# Patient Record
Sex: Male | Born: 1995 | Race: Black or African American | Hispanic: No | Marital: Single | Smoking: Current every day smoker
Health system: Southern US, Community
[De-identification: ages and names within clinical notes are randomized; demographics above are authoritative.]

## PROBLEM LIST (undated history)

## (undated) DIAGNOSIS — E669 Obesity, unspecified: Secondary | ICD-10-CM

## (undated) HISTORY — DX: Obesity, unspecified: E66.9

---
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
## 2015-03-13 ENCOUNTER — Ambulatory Visit (INDEPENDENT_AMBULATORY_CARE_PROVIDER_SITE_OTHER): Payer: 59 | Admitting: Family Medicine

## 2015-03-13 ENCOUNTER — Encounter: Payer: Self-pay | Admitting: Family Medicine

## 2015-03-13 VITALS — BP 120/80 | HR 98 | Wt 234.0 lb

## 2015-03-13 DIAGNOSIS — K602 Anal fissure, unspecified: Secondary | ICD-10-CM

## 2015-03-13 NOTE — Progress Notes (Signed)
   Subjective:    Patient ID: Donald Quaker., male    DOB: 1996-06-29, 19 y.o.   MRN: [id number]  HPI he states that over the last 3 days, he has seen bright red blood with bowel movements. He does not complain of any pain with them. He has not been constipated. He notes blood on the toilet paper.  Review of Systems     Objective:   Physical Exam Alert and in no distress. Anal exam does show a fissure present at 6:00 that was slightly tender to palpation. No other lesions seen.       Assessment & Plan:  Anal fissure  I discussed the diagnosis with him and recommended fluids, bulk in his diet, exercise, gentle cleaning to the area as well as using a shower to irrigate the area. He will call if further trouble.

## 2015-03-13 NOTE — Patient Instructions (Signed)
Fluids, bulk in your diet, exercise

## 2017-01-04 ENCOUNTER — Emergency Department (HOSPITAL_COMMUNITY)
Admission: EM | Admit: 2017-01-04 | Discharge: 2017-01-04 | Disposition: A | Discharge status: Home / Self Care | Payer: 59 | Attending: Emergency Medicine | Admitting: Emergency Medicine

## 2017-01-04 ENCOUNTER — Encounter (HOSPITAL_COMMUNITY): Payer: Self-pay | Admitting: Emergency Medicine

## 2017-01-04 ENCOUNTER — Emergency Department (HOSPITAL_COMMUNITY): Payer: 59

## 2017-01-04 DIAGNOSIS — W228XXA Striking against or struck by other objects, initial encounter: Secondary | ICD-10-CM | POA: Diagnosis not present

## 2017-01-04 DIAGNOSIS — Y929 Unspecified place or not applicable: Secondary | ICD-10-CM | POA: Insufficient documentation

## 2017-01-04 DIAGNOSIS — S62316A Displaced fracture of base of fifth metacarpal bone, right hand, initial encounter for closed fracture: Secondary | ICD-10-CM | POA: Diagnosis not present

## 2017-01-04 DIAGNOSIS — F17210 Nicotine dependence, cigarettes, uncomplicated: Secondary | ICD-10-CM | POA: Diagnosis not present

## 2017-01-04 DIAGNOSIS — Y9389 Activity, other specified: Secondary | ICD-10-CM | POA: Insufficient documentation

## 2017-01-04 DIAGNOSIS — S6991XA Unspecified injury of right wrist, hand and finger(s), initial encounter: Secondary | ICD-10-CM | POA: Diagnosis present

## 2017-01-04 DIAGNOSIS — Y999 Unspecified external cause status: Secondary | ICD-10-CM | POA: Diagnosis not present

## 2017-01-04 MED ORDER — IBUPROFEN 800 MG PO TABS
800.0000 mg | ORAL_TABLET | Freq: Once | ORAL | Status: AC
  Administered 2017-01-04: 800 mg via ORAL
  Filled 2017-01-04: qty 1

## 2017-01-04 MED ORDER — MELOXICAM 15 MG PO TABS: 15.0000 mg | ORAL_TABLET | Freq: Every day | ORAL | 0 refills | Status: DC

## 2017-01-04 NOTE — Discharge Instructions (Signed)
Get help right away if: °· You develop a rash. °· You have trouble breathing. °· Your skin or nails on your injured hand turn blue or gray even after you loosen your splint. °· Your injured hand feels cold or becomes numb even after you loosen your splint. °· You develop severe pain under the cast or in your hand. ° °
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
## 2017-01-04 NOTE — ED Notes (Signed)
Ortho tech at bedside 
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
## 2017-01-04 NOTE — ED Notes (Signed)
Donald DikeJennifer with ortho contacted this RN in response to page, made aware of need for splint and sling

## 2017-01-04 NOTE — Progress Notes (Signed)
Orthopedic Tech Progress Note Patient Details:  Donald QuakerJon R Crader Jr. 1996-01-09 [id number]  Ortho Devices Type of Ortho Device: Ace wrap, Unna boot Ortho Device/Splint Interventions: Application   Saul FordyceJennifer C Melessia Kaus 01/04/2017, 7:56 AM

## 2017-01-04 NOTE — ED Notes (Signed)
Ortho paged for splint and sling  placement

## 2017-01-04 NOTE — ED Notes (Signed)
ED Provider at bedside. 

## 2017-07-13 ENCOUNTER — Encounter: Payer: Self-pay | Admitting: Family Medicine

## 2017-07-13 ENCOUNTER — Ambulatory Visit (INDEPENDENT_AMBULATORY_CARE_PROVIDER_SITE_OTHER): Payer: 59 | Admitting: Family Medicine

## 2017-07-13 VITALS — BP 132/96 | Temp 98.4°F | Ht 66.0 in | Wt 258.0 lb

## 2017-07-13 DIAGNOSIS — R300 Dysuria: Secondary | ICD-10-CM | POA: Diagnosis not present

## 2017-07-13 DIAGNOSIS — R030 Elevated blood-pressure reading, without diagnosis of hypertension: Secondary | ICD-10-CM | POA: Diagnosis not present

## 2017-07-13 DIAGNOSIS — Z9189 Other specified personal risk factors, not elsewhere classified: Secondary | ICD-10-CM | POA: Diagnosis not present

## 2017-07-13 LAB — POC URINALSYSI DIPSTICK (AUTOMATED)
BILIRUBIN UA: NEGATIVE
Blood, UA: NEGATIVE
Glucose, UA: NEGATIVE
KETONES UA: NEGATIVE
Nitrite, UA: NEGATIVE
Protein, UA: 0.15
Urobilinogen, UA: 2 E.U./dL — AB
pH, UA: 6 (ref 5.0–8.0)

## 2017-07-13 MED ORDER — AZITHROMYCIN 250 MG PO TABS: ORAL_TABLET | ORAL | 0 refills | Status: AC

## 2017-07-13 NOTE — Patient Instructions (Signed)
Take the antibiotic, increase your water intake and we will you with your results.   If you develop fever, worsening pain, vomiting or any other new symptoms then call or return.  Your blood pressure is elevated today.  I recommend you check this outside of here and if you are seeing blood pressures greater than 130/80, return to see your primary care provider.   You are actually overdue for a complete physical exam and I recommend that you schedule this with your primary care as well.

## 2017-07-13 NOTE — Progress Notes (Signed)
   Subjective:    Patient ID: Donald QuakerJon R Akey Jr., male    DOB: [birth year], 21 y.o.   MRN: [id number]  HPI Chief Complaint  Patient presents with  . Pain    Pt stated having pain/burning sensation when urinating for 1 week.   He is here with complaints of burning with urination for the past week.  Reports having "clear discharge" only in the morning when awakening. Sates this is new. Also reports having some nausea and decreased appetite. He is concerned that he may have an STD. Unprotected sex 2 weeks ago with a male partner. Denies history of STD.  Denies changes in urinary stream. No urinary frequency, urgency or hesitancy. He does feel like he is able to completely empty his bladder.   Denies fever, chills, dizziness, chest pain, palpitations,  abdominal pain, low back pain, testicular pain, vomiting.     2 weeks ago he had unprotected sex with a male.  Denies history of STI.    Review of Systems Pertinent positives and negatives in the history of present illness.     Objective:   Physical Exam  Constitutional: He is oriented to person, place, and time. He appears well-developed and well-nourished. No distress.  Eyes: Conjunctivae are normal.  Cardiovascular: Normal rate and regular rhythm.  Pulmonary/Chest: Effort normal.  Abdominal: Soft. Bowel sounds are normal. He exhibits no distension. There is no tenderness. There is no CVA tenderness.  Genitourinary:  Genitourinary Comments: Declines   Neurological: He is alert and oriented to person, place, and time.  Skin: Skin is warm and dry.   BP (!) 132/96   Temp 98.4 F (36.9 C)   Ht 5\' 6"  (1.676 m)   Wt 258 lb (117 kg)   BMI 41.64 kg/m       Assessment & Plan:  Dysuria - Plan: POCT Urinalysis Dipstick (Automated), RPR, Urine Culture, HIV antibody, C. trachomatis/N. gonorrhoeae RNA, azithromycin (ZITHROMAX) 250 MG tablet  At risk for sexually transmitted disease due to unprotected sex - Plan: POCT Urinalysis  Dipstick (Automated), RPR, Urine Culture, HIV antibody, C. trachomatis/N. gonorrhoeae RNA, azithromycin (ZITHROMAX) 250 MG tablet  Elevated blood pressure reading without diagnosis of hypertension  Urinalysis dipstick-specific gravity 1.030, trace protein, otherwise negative. GC/CT, HIV, RPR testing done. Urine culture sent. Suspicious for STD exposure.  Azithromycin 1 g x1 dose sent to his pharmacy. We will follow-up pending results. Discussed that his blood pressure is elevated today.  He does not have a history of hypertension.  Plan to have him check his blood pressures outside of here and he will follow-up with his primary care provider.  He is overdue for annual wellness exam.

## 2017-07-14 ENCOUNTER — Telehealth: Payer: Self-pay

## 2017-07-14 LAB — URINE CULTURE
MICRO NUMBER:: 81368029
RESULT: NO GROWTH
SPECIMEN QUALITY: ADEQUATE

## 2017-07-14 LAB — HIV ANTIBODY (ROUTINE TESTING W REFLEX): HIV: NONREACTIVE

## 2017-07-14 LAB — RPR: RPR Ser Ql: NONREACTIVE

## 2017-07-14 LAB — C. TRACHOMATIS/N. GONORRHOEAE RNA
C. trachomatis RNA, TMA: NOT DETECTED
N. gonorrhoeae RNA, TMA: NOT DETECTED

## 2017-07-14 NOTE — Telephone Encounter (Signed)
Called patient and gave lab results. Patient had no questions or concerns.  

## 2017-07-14 NOTE — Telephone Encounter (Signed)
-----   Message from Avanell ShackletonVickie L Henson, NP-C sent at 07/14/2017  4:58 PM EST ----- All of his STD tests were negative which is good. I am still waiting on his urine culture result.

## 2017-07-15 ENCOUNTER — Telehealth: Payer: Self-pay

## 2017-07-15 NOTE — Telephone Encounter (Signed)
-----   Message from Avanell ShackletonVickie L Henson, NP-C sent at 07/15/2017 11:13 AM EST ----- If he is still having urinary symptoms, have him return next week. If the symptoms have cleared up then he is fine.

## 2017-07-15 NOTE — Telephone Encounter (Signed)
-----   Message from Anna M Polk sent at 07/15/2017  8:45 AM EST -----   ----- Message ----- From: Henson, Vickie L, NP-C Sent: 07/14/2017   9:14 PM To: Sabrina A Johnson, CMA  His urine culture did not grow any bacteria.  No sign of urinary tract infection.  If he is still having urinary symptoms, he will need to follow-up for further evaluation. 

## 2017-07-15 NOTE — Telephone Encounter (Signed)
-----   Message from Marlana [facility]na [facility] sent at 07/15/2017  8:45 AM EST -----   ----- Message ----- From: Avanell ShackletonHenson, Vickie L, NP-C Sent: 07/14/2017   9:14 PM To: Britt BoozerSabrina A Johnson, CMA  His urine culture did not grow any bacteria.  No sign of urinary tract infection.  If he is still having urinary symptoms, he will need to follow-up for further evaluation.

## 2017-07-15 NOTE — Telephone Encounter (Signed)
Called patient and gave lab results. Patient would like to know when to come back, Vickie advised. Will call back for an appointment.

## 2017-07-15 NOTE — Telephone Encounter (Signed)
Attempted to contact patient, did not answer and no voicemail to leave a message. Will try again.

## 2017-07-21 ENCOUNTER — Telehealth: Payer: Self-pay

## 2017-07-21 NOTE — Telephone Encounter (Signed)
Attempted to contact for lab results, no voicemail to leave a message.

## 2017-07-21 NOTE — Telephone Encounter (Signed)
-----   Message from Vickie L Henson, NP-C sent at 07/15/2017 11:13 AM EST ----- If he is still having urinary symptoms, have him return next week. If the symptoms have cleared up then he is fine. 

## 2017-09-21 ENCOUNTER — Encounter: Payer: 59 | Admitting: Family Medicine

## 2017-09-22 ENCOUNTER — Encounter: Payer: Self-pay | Admitting: Family Medicine
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
## 2021-07-14 ENCOUNTER — Emergency Department (HOSPITAL_BASED_OUTPATIENT_CLINIC_OR_DEPARTMENT_OTHER)
Admission: EM | Admit: 2021-07-14 | Discharge: 2021-07-14 | Disposition: A | Discharge status: Home / Self Care | Payer: Self-pay | Attending: Emergency Medicine | Admitting: Emergency Medicine

## 2021-07-14 ENCOUNTER — Emergency Department (HOSPITAL_BASED_OUTPATIENT_CLINIC_OR_DEPARTMENT_OTHER): Payer: Self-pay | Admitting: Radiology

## 2021-07-14 ENCOUNTER — Encounter (HOSPITAL_BASED_OUTPATIENT_CLINIC_OR_DEPARTMENT_OTHER): Payer: Self-pay | Admitting: Emergency Medicine

## 2021-07-14 ENCOUNTER — Other Ambulatory Visit: Payer: Self-pay

## 2021-07-14 DIAGNOSIS — F17210 Nicotine dependence, cigarettes, uncomplicated: Secondary | ICD-10-CM | POA: Insufficient documentation

## 2021-07-14 DIAGNOSIS — M79642 Pain in left hand: Secondary | ICD-10-CM | POA: Insufficient documentation

## 2021-07-14 DIAGNOSIS — Y9301 Activity, walking, marching and hiking: Secondary | ICD-10-CM | POA: Insufficient documentation

## 2021-07-14 DIAGNOSIS — S62365A Nondisplaced fracture of neck of fourth metacarpal bone, left hand, initial encounter for closed fracture: Secondary | ICD-10-CM

## 2021-07-14 DIAGNOSIS — W08XXXA Fall from other furniture, initial encounter: Secondary | ICD-10-CM | POA: Insufficient documentation

## 2021-07-14 DIAGNOSIS — S62355A Nondisplaced fracture of shaft of fourth metacarpal bone, left hand, initial encounter for closed fracture: Secondary | ICD-10-CM | POA: Insufficient documentation

## 2021-07-14 MED ORDER — NAPROXEN 250 MG PO TABS
500.0000 mg | ORAL_TABLET | Freq: Once | ORAL | Status: AC
  Administered 2021-07-14: 500 mg via ORAL
  Filled 2021-07-14: qty 2

## 2021-07-14 MED ORDER — NAPROXEN 500 MG PO TABS: 500.0000 mg | ORAL_TABLET | Freq: Two times a day (BID) | ORAL | 0 refills | Status: AC
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
## 2021-07-14 NOTE — Discharge Instructions (Addendum)
I have prescribed naproxen to  help with swelling.  Please take 1 tablet twice a day for the next 7 days with food.  Please maintain your hand elevated while sitting down.  The Number to Dr. Yehuda Budd hand specialist attached to your chart, please get an appointment for further management.
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
## 2023-09-07 ENCOUNTER — Other Ambulatory Visit: Payer: Self-pay

## 2023-09-07 ENCOUNTER — Emergency Department (HOSPITAL_BASED_OUTPATIENT_CLINIC_OR_DEPARTMENT_OTHER)
Admission: EM | Admit: 2023-09-07 | Discharge: 2023-09-07 | Disposition: A | Discharge status: Home / Self Care | Payer: Self-pay | Attending: Emergency Medicine | Admitting: Emergency Medicine

## 2023-09-07 ENCOUNTER — Encounter (HOSPITAL_BASED_OUTPATIENT_CLINIC_OR_DEPARTMENT_OTHER): Payer: Self-pay | Admitting: Emergency Medicine

## 2023-09-07 DIAGNOSIS — T23221A Burn of second degree of single right finger (nail) except thumb, initial encounter: Secondary | ICD-10-CM | POA: Insufficient documentation

## 2023-09-07 DIAGNOSIS — Y990 Civilian activity done for income or pay: Secondary | ICD-10-CM | POA: Insufficient documentation

## 2023-09-07 DIAGNOSIS — T23039A Burn of unspecified degree of unspecified multiple fingers (nail), not including thumb, initial encounter: Secondary | ICD-10-CM

## 2023-09-07 DIAGNOSIS — T310 Burns involving less than 10% of body surface: Secondary | ICD-10-CM | POA: Insufficient documentation

## 2023-09-07 DIAGNOSIS — X19XXXA Contact with other heat and hot substances, initial encounter: Secondary | ICD-10-CM | POA: Insufficient documentation

## 2023-09-07 DIAGNOSIS — T23242A Burn of second degree of multiple left fingers (nail), including thumb, initial encounter: Secondary | ICD-10-CM | POA: Insufficient documentation
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
## 2023-09-07 NOTE — ED Notes (Signed)
Pt reports need for work note

## 2023-09-07 NOTE — ED Notes (Signed)
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
